# Patient Record
Sex: Female | Born: 1955 | Race: White | Hispanic: No | Marital: Single | Smoking: Never smoker
Health system: Southern US, Community
[De-identification: ages and names within clinical notes are randomized; demographics above are authoritative.]

## PROBLEM LIST (undated history)

## (undated) DIAGNOSIS — E785 Hyperlipidemia, unspecified: Secondary | ICD-10-CM

## (undated) DIAGNOSIS — L719 Rosacea, unspecified: Secondary | ICD-10-CM

## (undated) DIAGNOSIS — K219 Gastro-esophageal reflux disease without esophagitis: Secondary | ICD-10-CM

## (undated) DIAGNOSIS — E119 Type 2 diabetes mellitus without complications: Secondary | ICD-10-CM

## (undated) HISTORY — DX: Hyperlipidemia, unspecified: E78.5

## (undated) HISTORY — DX: Gastro-esophageal reflux disease without esophagitis: K21.9

## (undated) HISTORY — DX: Rosacea, unspecified: L71.9

## (undated) HISTORY — DX: Type 2 diabetes mellitus without complications: E11.9

---

## 2008-08-01 ENCOUNTER — Emergency Department (HOSPITAL_BASED_OUTPATIENT_CLINIC_OR_DEPARTMENT_OTHER): Admission: EM | Admit: 2008-08-01 | Discharge: 2008-08-01 | Payer: Self-pay | Admitting: Emergency Medicine

## 2008-08-02 ENCOUNTER — Emergency Department (HOSPITAL_BASED_OUTPATIENT_CLINIC_OR_DEPARTMENT_OTHER): Admission: EM | Admit: 2008-08-02 | Discharge: 2008-08-02 | Payer: Self-pay | Admitting: Emergency Medicine

## 2009-12-20 ENCOUNTER — Other Ambulatory Visit: Admission: RE | Admit: 2009-12-20 | Discharge: 2009-12-20 | Payer: Self-pay | Admitting: Obstetrics and Gynecology

## 2013-12-12 ENCOUNTER — Encounter: Payer: Self-pay | Admitting: *Deleted

## 2014-03-25 ENCOUNTER — Other Ambulatory Visit (HOSPITAL_COMMUNITY)
Admission: RE | Admit: 2014-03-25 | Discharge: 2014-03-25 | Disposition: A | Discharge status: Home / Self Care | Payer: 59 | Source: Ambulatory Visit | Attending: Obstetrics and Gynecology | Admitting: Obstetrics and Gynecology

## 2014-03-25 ENCOUNTER — Other Ambulatory Visit: Payer: Self-pay | Admitting: Obstetrics and Gynecology

## 2014-03-25 DIAGNOSIS — Z01419 Encounter for gynecological examination (general) (routine) without abnormal findings: Secondary | ICD-10-CM | POA: Diagnosis not present

## 2014-03-25 DIAGNOSIS — Z1151 Encounter for screening for human papillomavirus (HPV): Secondary | ICD-10-CM | POA: Diagnosis present

## 2014-04-03 LAB — CYTOLOGY - PAP

## 2014-10-02 ENCOUNTER — Ambulatory Visit (INDEPENDENT_AMBULATORY_CARE_PROVIDER_SITE_OTHER): Payer: 59 | Admitting: Podiatry

## 2014-10-02 ENCOUNTER — Ambulatory Visit: Payer: Self-pay

## 2014-10-02 ENCOUNTER — Encounter: Payer: Self-pay | Admitting: Podiatry

## 2014-10-02 VITALS — BP 127/78 | HR 82 | Resp 15 | Ht 61.5 in | Wt 159.0 lb

## 2014-10-02 DIAGNOSIS — E119 Type 2 diabetes mellitus without complications: Secondary | ICD-10-CM | POA: Diagnosis not present

## 2014-10-02 DIAGNOSIS — L84 Corns and callosities: Secondary | ICD-10-CM | POA: Diagnosis not present

## 2014-10-02 DIAGNOSIS — M79671 Pain in right foot: Secondary | ICD-10-CM | POA: Diagnosis not present

## 2014-10-02 NOTE — Progress Notes (Signed)
   Subjective:    Patient ID: Melanie Ross, female    DOB: 1955-08-25, 59 y.o.   MRN: [id number]  HPI Patient presents with right foot, great toe. Patient slammed toe 3 months ago. Does not feel any pain, but walking on to is uncomfortable. Does feel some discomfort. 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 [facility] age 89 [facility]. 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 Pt does not want anything to be done to this.  Pt also presents with fungus on right foot, great toe. Pt would like to have nails looked at to see if fungus is on other nails. Pt does have orthotics and is diabetic.   Review of Systems  Eyes: Positive for visual disturbance.  Neurological: Positive for weakness and numbness.  Psychiatric/Behavioral: The patient is nervous/anxious.   All other systems reviewed and are negative.      Objective:   Physical Exam        Assessment & Plan:

## 2014-10-12 ENCOUNTER — Ambulatory Visit: Payer: 59 | Admitting: Podiatry

## 2015-05-21 DIAGNOSIS — L84 Corns and callosities: Secondary | ICD-10-CM

## 2016-09-26 DIAGNOSIS — E782 Mixed hyperlipidemia: Secondary | ICD-10-CM | POA: Diagnosis not present

## 2016-09-26 DIAGNOSIS — K219 Gastro-esophageal reflux disease without esophagitis: Secondary | ICD-10-CM | POA: Diagnosis not present

## 2016-09-26 DIAGNOSIS — Z1159 Encounter for screening for other viral diseases: Secondary | ICD-10-CM | POA: Diagnosis not present

## 2016-09-26 DIAGNOSIS — Z7984 Long term (current) use of oral hypoglycemic drugs: Secondary | ICD-10-CM | POA: Diagnosis not present

## 2016-09-26 DIAGNOSIS — E1165 Type 2 diabetes mellitus with hyperglycemia: Secondary | ICD-10-CM | POA: Diagnosis not present

## 2016-12-29 DIAGNOSIS — H25013 Cortical age-related cataract, bilateral: Secondary | ICD-10-CM | POA: Diagnosis not present

## 2016-12-29 DIAGNOSIS — Z7984 Long term (current) use of oral hypoglycemic drugs: Secondary | ICD-10-CM | POA: Diagnosis not present

## 2016-12-29 DIAGNOSIS — E113313 Type 2 diabetes mellitus with moderate nonproliferative diabetic retinopathy with macular edema, bilateral: Secondary | ICD-10-CM | POA: Diagnosis not present

## 2016-12-29 DIAGNOSIS — H2513 Age-related nuclear cataract, bilateral: Secondary | ICD-10-CM | POA: Diagnosis not present

## 2017-04-02 ENCOUNTER — Other Ambulatory Visit (HOSPITAL_COMMUNITY)
Admission: RE | Admit: 2017-04-02 | Discharge: 2017-04-02 | Disposition: A | Discharge status: Home / Self Care | Payer: BLUE CROSS/BLUE SHIELD | Source: Ambulatory Visit | Attending: Obstetrics and Gynecology | Admitting: Obstetrics and Gynecology

## 2017-04-02 ENCOUNTER — Other Ambulatory Visit: Payer: Self-pay | Admitting: Obstetrics and Gynecology

## 2017-04-02 DIAGNOSIS — Z01419 Encounter for gynecological examination (general) (routine) without abnormal findings: Secondary | ICD-10-CM | POA: Insufficient documentation

## 2017-04-03 LAB — CYTOLOGY - PAP
DIAGNOSIS: NEGATIVE
HPV: NOT DETECTED

## 2017-04-16 DIAGNOSIS — E782 Mixed hyperlipidemia: Secondary | ICD-10-CM | POA: Diagnosis not present

## 2017-04-16 DIAGNOSIS — E119 Type 2 diabetes mellitus without complications: Secondary | ICD-10-CM | POA: Diagnosis not present

## 2017-04-16 DIAGNOSIS — K219 Gastro-esophageal reflux disease without esophagitis: Secondary | ICD-10-CM | POA: Diagnosis not present

## 2017-04-16 DIAGNOSIS — L719 Rosacea, unspecified: Secondary | ICD-10-CM | POA: Diagnosis not present

## 2017-05-01 DIAGNOSIS — E113392 Type 2 diabetes mellitus with moderate nonproliferative diabetic retinopathy without macular edema, left eye: Secondary | ICD-10-CM | POA: Diagnosis not present

## 2017-05-01 DIAGNOSIS — Z7984 Long term (current) use of oral hypoglycemic drugs: Secondary | ICD-10-CM | POA: Diagnosis not present

## 2017-05-01 DIAGNOSIS — E113311 Type 2 diabetes mellitus with moderate nonproliferative diabetic retinopathy with macular edema, right eye: Secondary | ICD-10-CM | POA: Diagnosis not present

## 2017-07-23 ENCOUNTER — Ambulatory Visit (INDEPENDENT_AMBULATORY_CARE_PROVIDER_SITE_OTHER): Payer: BLUE CROSS/BLUE SHIELD

## 2017-07-23 ENCOUNTER — Ambulatory Visit (INDEPENDENT_AMBULATORY_CARE_PROVIDER_SITE_OTHER): Payer: BLUE CROSS/BLUE SHIELD | Admitting: Orthopedic Surgery

## 2017-07-23 ENCOUNTER — Encounter (INDEPENDENT_AMBULATORY_CARE_PROVIDER_SITE_OTHER): Payer: Self-pay | Admitting: Orthopedic Surgery

## 2017-07-23 DIAGNOSIS — M25562 Pain in left knee: Secondary | ICD-10-CM

## 2017-07-23 MED ORDER — LIDOCAINE HCL 1 % IJ SOLN
5.0000 mL | INTRAMUSCULAR | Status: AC | PRN
  Administered 2017-07-23: 5 mL

## 2017-07-23 MED ORDER — METHYLPREDNISOLONE ACETATE 40 MG/ML IJ SUSP
40.0000 mg | INTRAMUSCULAR | Status: AC | PRN
  Administered 2017-07-23: 40 mg via INTRA_ARTICULAR

## 2017-07-23 NOTE — Progress Notes (Signed)
Office Visit Note   Patient: Melanie Ross           Date of Birth: 07-Dec-1955           MRN: [id number] Visit Date: 07/23/2017              Requested by: Clovis Riley, L.August Saucer, MD [address] [place], [place] [address] PCP: Clovis Riley, L.August Saucer, MD  Chief Complaint  Patient presents with  . Left Knee - Follow-up      HPI: Patient is a 62 year old woman who presents with left knee pain for 9 months.  She states she initially injured it about 9 months ago and felt a crunching sensation in her knee.  She states she has had pain burning and popping worse with walking.  She uses an over-the-counter neoprene sleeve.  She states she has an allergy to aspirin.  Assessment & Plan: Visit Diagnoses:  1. Left knee pain, unspecified chronicity     Plan: Left knee was injected follow-up in 3 weeks for reevaluation.  Discussed that if she is still symptomatic we would need to set her up for an MRI scan to evaluate for possible osteochondral defect or meniscal tear.  Follow-Up Instructions: Return in about 3 weeks (around 08/13/2017).   Ortho Exam  Patient is alert, oriented, no adenopathy, well-dressed, normal affect, normal respiratory effort. Examination patient has an antalgic gait there is no effusion no redness no cellulitis.  She is very tender to palpation over the medial joint line flexion and rotation is painful.  Collaterals and cruciates are stable.  Imaging: Xr Knee 1-2 Views Left  Result Date: 07/23/2017 2 view radiographs of the left knee shows even joint space of the medial joint line in both knees with no subcondylar cysts no periarticular bony spurs.  No images are attached to the encounter.  Labs: No results found for: HGBA1C, ESRSEDRATE, CRP, LABURIC, REPTSTATUS, GRAMSTAIN, CULT, LABORGA  No results found for: HGBA1C  There is no height or weight on file to calculate BMI.  Orders:  Orders Placed This Encounter  Procedures  . XR Knee 1-2 Views Left    No orders of the defined types were placed in this encounter.    Procedures: Large Joint Inj: L knee on 07/23/2017 9:17 AM Indications: pain and diagnostic evaluation Details: 22 G 1.5 in needle, anteromedial approach  Arthrogram: No  Medications: 5 mL lidocaine 1 %; 40 mg methylPREDNISolone acetate 40 MG/ML Outcome: tolerated well, no immediate complications Procedure, treatment alternatives, risks and benefits explained, specific risks discussed. Consent was given by the patient. Immediately prior to procedure a time out was called to verify the correct patient, procedure, equipment, support staff and site/side marked as required. Patient was prepped and draped in the usual sterile fashion.      Clinical Data: No additional findings.  ROS:  All other systems negative, except as noted in the HPI. Review of Systems  Objective: Vital Signs: There were no vitals taken for this visit.  Specialty Comments:  No specialty comments available.  PMFS History: There are no active problems to display for this patient.  Past Medical History:  Diagnosis Date  . Diabetes (HCC)   . GERD (gastroesophageal reflux disease)   . Hyperlipidemia   . Rosacea     Family History  Family history unknown: Yes    History reviewed. No pertinent surgical history. Social History   Occupational History  . Not on file  Tobacco Use  . Smoking status: Never  Smoker  . Smokeless tobacco: Never Used  Substance and Sexual Activity  . Alcohol use: Not on file  . Drug use: Not on file  . Sexual activity: Not on file

## 2017-08-06 ENCOUNTER — Other Ambulatory Visit (INDEPENDENT_AMBULATORY_CARE_PROVIDER_SITE_OTHER): Payer: Self-pay | Admitting: Orthopedic Surgery

## 2017-08-06 ENCOUNTER — Telehealth (INDEPENDENT_AMBULATORY_CARE_PROVIDER_SITE_OTHER): Payer: Self-pay | Admitting: Orthopedic Surgery

## 2017-08-06 DIAGNOSIS — M25562 Pain in left knee: Principal | ICD-10-CM

## 2017-08-06 DIAGNOSIS — G8929 Other chronic pain: Secondary | ICD-10-CM

## 2017-08-06 NOTE — Telephone Encounter (Signed)
Patient called this morning stating that the Cortisone injection did not really work for her and she would like to move forward with the MRI.  CB#[phone].  Thank you.

## 2017-08-06 NOTE — Telephone Encounter (Signed)
Pt had injection 07/23/17 left knee and states that it has not helped. She wants to proceed with MRI to r/o [facility] or meniscal tear as dictated in your last note. Ok to set this up now or see her first?

## 2017-08-06 NOTE — Telephone Encounter (Signed)
I placed order for mri left knee

## 2017-08-06 NOTE — Telephone Encounter (Signed)
Called pt to advise

## 2017-08-11 ENCOUNTER — Ambulatory Visit (HOSPITAL_BASED_OUTPATIENT_CLINIC_OR_DEPARTMENT_OTHER)
Admission: RE | Admit: 2017-08-11 | Discharge: 2017-08-11 | Disposition: A | Discharge status: Home / Self Care | Payer: BLUE CROSS/BLUE SHIELD | Source: Ambulatory Visit | Attending: Orthopedic Surgery | Admitting: Orthopedic Surgery

## 2017-08-11 DIAGNOSIS — M25462 Effusion, left knee: Secondary | ICD-10-CM | POA: Insufficient documentation

## 2017-08-11 DIAGNOSIS — S83242A Other tear of medial meniscus, current injury, left knee, initial encounter: Secondary | ICD-10-CM | POA: Diagnosis not present

## 2017-08-11 DIAGNOSIS — S83282A Other tear of lateral meniscus, current injury, left knee, initial encounter: Secondary | ICD-10-CM | POA: Diagnosis not present

## 2017-08-11 DIAGNOSIS — G8929 Other chronic pain: Secondary | ICD-10-CM

## 2017-08-11 DIAGNOSIS — M7122 Synovial cyst of popliteal space [Baker], left knee: Secondary | ICD-10-CM | POA: Diagnosis not present

## 2017-08-11 DIAGNOSIS — M25562 Pain in left knee: Secondary | ICD-10-CM | POA: Diagnosis not present

## 2017-08-11 DIAGNOSIS — S83412A Sprain of medial collateral ligament of left knee, initial encounter: Secondary | ICD-10-CM | POA: Insufficient documentation

## 2017-08-14 ENCOUNTER — Encounter (INDEPENDENT_AMBULATORY_CARE_PROVIDER_SITE_OTHER): Payer: Self-pay | Admitting: Orthopedic Surgery

## 2017-08-14 ENCOUNTER — Ambulatory Visit (INDEPENDENT_AMBULATORY_CARE_PROVIDER_SITE_OTHER): Payer: BLUE CROSS/BLUE SHIELD | Admitting: Orthopedic Surgery

## 2017-08-14 DIAGNOSIS — S83242D Other tear of medial meniscus, current injury, left knee, subsequent encounter: Secondary | ICD-10-CM | POA: Diagnosis not present

## 2017-08-14 DIAGNOSIS — G8929 Other chronic pain: Secondary | ICD-10-CM | POA: Diagnosis not present

## 2017-08-14 DIAGNOSIS — M25562 Pain in left knee: Secondary | ICD-10-CM

## 2017-08-14 NOTE — Progress Notes (Signed)
   Office Visit Note   Patient: Melanie Ross           Date of Birth: Aug 05, 1955           MRN: [id number] Visit Date: 08/14/2017              Requested by: Clovis Riley, L.August Saucer, MD [address] [place], [place] [address] PCP: Clovis Riley, L.August Saucer, MD  Chief Complaint  Patient presents with  . Left Knee - Follow-up      HPI: Patient is a 62 year old woman who presents in follow-up for left knee she complains of mechanical symptoms the medial joint line.  She states that the injection relieved most of her symptoms other than the medial joint line symptoms.  Assessment & Plan: Visit Diagnoses:  1. Chronic pain of left knee   2. Other tear of medial meniscus, current injury, left knee, subsequent encounter     Plan: Continue conservative treatment.  Discussed that if her symptoms worsen that we could proceed with arthroscopic intervention.  Discussed that this should relieve the symptoms from the meniscal tear but is about 50-50 of relieving the arthritic symptoms.  Discussed that she may require a total knee arthroplasty in the future.  She will call Elnita Maxwell if she wants to pursue outpatient arthroscopy for the left knee.  Follow-Up Instructions: Return if symptoms worsen or fail to improve.   Ortho Exam  Patient is alert, oriented, no adenopathy, well-dressed, normal affect, normal respiratory effort. Examination patient has an antalgic gait she is tender to palpation of the medial joint line collaterals and cruciates are stable.  Review of the MRI scan of the left knee shows a tear of the medial meniscus as well as a tear of the lateral meniscus a Baker's cyst and degenerative changes of the medial joint line.  Imaging: No results found. No images are attached to the encounter.  Labs: No results found for: HGBA1C, ESRSEDRATE, CRP, LABURIC, REPTSTATUS, GRAMSTAIN, CULT, LABORGA   No results found for: ALBUMIN, PREALBUMIN, LABURIC  There is no height or weight on  file to calculate BMI.  Orders:  No orders of the defined types were placed in this encounter.  No orders of the defined types were placed in this encounter.    Procedures: No procedures performed  Clinical Data: No additional findings.  ROS:  All other systems negative, except as noted in the HPI. Review of Systems  Objective: Vital Signs: There were no vitals taken for this visit.  Specialty Comments:  No specialty comments available.  PMFS History: There are no active problems to display for this patient.  Past Medical History:  Diagnosis Date  . Diabetes (HCC)   . GERD (gastroesophageal reflux disease)   . Hyperlipidemia   . Rosacea     Family History  Family history unknown: Yes    History reviewed. No pertinent surgical history. Social History   Occupational History  . Not on file  Tobacco Use  . Smoking status: Never Smoker  . Smokeless tobacco: Never Used  Substance and Sexual Activity  . Alcohol use: Not on file  . Drug use: Not on file  . Sexual activity: Not on file

## 2017-08-20 DIAGNOSIS — H25013 Cortical age-related cataract, bilateral: Secondary | ICD-10-CM | POA: Diagnosis not present

## 2017-08-20 DIAGNOSIS — E113311 Type 2 diabetes mellitus with moderate nonproliferative diabetic retinopathy with macular edema, right eye: Secondary | ICD-10-CM | POA: Diagnosis not present

## 2017-08-20 DIAGNOSIS — E113392 Type 2 diabetes mellitus with moderate nonproliferative diabetic retinopathy without macular edema, left eye: Secondary | ICD-10-CM | POA: Diagnosis not present

## 2017-08-20 DIAGNOSIS — H2513 Age-related nuclear cataract, bilateral: Secondary | ICD-10-CM | POA: Diagnosis not present

## 2017-08-21 ENCOUNTER — Telehealth (INDEPENDENT_AMBULATORY_CARE_PROVIDER_SITE_OTHER): Payer: Self-pay | Admitting: Orthopedic Surgery

## 2017-08-21 NOTE — Telephone Encounter (Signed)
You saw this pt on 08/14/17 and had discussed possible knee scope for meniscal tear but advised that this will not help her arthritic pain. Pt is wanting to know if she can have a gel injection if this would be helpful and question what the procedure would be if she had surgery

## 2017-08-21 NOTE — Telephone Encounter (Signed)
Yes we can request a hyaluronic acid injection from insurance and provide this when available.  This should help cushion her arthritis in her knee.  The arthroscopic intervention would be to debride the meniscal tear and debride any articular cartilage tears.

## 2017-08-21 NOTE — Telephone Encounter (Signed)
Patient called wanting to know more about her possible Arthroscopic surgery and also wanted to see if she could possibly get a gel injection instead of surgery. CB # [phone]

## 2017-08-22 ENCOUNTER — Telehealth (INDEPENDENT_AMBULATORY_CARE_PROVIDER_SITE_OTHER): Payer: Self-pay

## 2017-08-22 NOTE — Telephone Encounter (Signed)
Submitted application online for SynviscOne injection, left knee.  

## 2017-08-22 NOTE — Telephone Encounter (Signed)
I called pt to advise of the message below. She would like to have injection. Advised that she would like to have before 09/17/17 as it has something to do with her medical coverage or her deductible starting over?  I advised that I would advise to make this an urgent request.

## 2017-08-22 NOTE — Telephone Encounter (Signed)
Noted.  Will submit today.

## 2017-08-24 DIAGNOSIS — E113311 Type 2 diabetes mellitus with moderate nonproliferative diabetic retinopathy with macular edema, right eye: Secondary | ICD-10-CM | POA: Diagnosis not present

## 2017-08-24 DIAGNOSIS — H25013 Cortical age-related cataract, bilateral: Secondary | ICD-10-CM | POA: Diagnosis not present

## 2017-08-24 DIAGNOSIS — E113392 Type 2 diabetes mellitus with moderate nonproliferative diabetic retinopathy without macular edema, left eye: Secondary | ICD-10-CM | POA: Diagnosis not present

## 2017-08-24 DIAGNOSIS — H2513 Age-related nuclear cataract, bilateral: Secondary | ICD-10-CM | POA: Diagnosis not present

## 2017-08-30 ENCOUNTER — Encounter (INDEPENDENT_AMBULATORY_CARE_PROVIDER_SITE_OTHER): Payer: Self-pay | Admitting: Radiology

## 2017-08-30 NOTE — Progress Notes (Unsigned)
[facility] Synvisc One injection.  IC patient and advised.

## 2017-09-03 ENCOUNTER — Telehealth (INDEPENDENT_AMBULATORY_CARE_PROVIDER_SITE_OTHER): Payer: Self-pay | Admitting: Orthopedic Surgery

## 2017-09-03 ENCOUNTER — Telehealth (INDEPENDENT_AMBULATORY_CARE_PROVIDER_SITE_OTHER): Payer: Self-pay

## 2017-09-03 DIAGNOSIS — H2513 Age-related nuclear cataract, bilateral: Secondary | ICD-10-CM | POA: Diagnosis not present

## 2017-09-03 DIAGNOSIS — D3131 Benign neoplasm of right choroid: Secondary | ICD-10-CM | POA: Diagnosis not present

## 2017-09-03 DIAGNOSIS — E113311 Type 2 diabetes mellitus with moderate nonproliferative diabetic retinopathy with macular edema, right eye: Secondary | ICD-10-CM | POA: Diagnosis not present

## 2017-09-03 NOTE — Telephone Encounter (Signed)
Patient called stating that she has not heard anything about her Synvisc injection and wanted to know the status.  CB#[phone].  Thank you.

## 2017-09-03 NOTE — Telephone Encounter (Signed)
Talked with patient and advised her that PA was submitted on 08/30/17 to [facility] and we are currently waiting on a response from [facility].  Advised patient that we will call once PA has been approved.

## 2017-09-04 NOTE — Telephone Encounter (Signed)
April spoke to patient with update yesterday

## 2017-09-06 ENCOUNTER — Telehealth (INDEPENDENT_AMBULATORY_CARE_PROVIDER_SITE_OTHER): Payer: Self-pay

## 2017-09-06 NOTE — Telephone Encounter (Signed)
Talked with patient and advised her that PA was still pending and that I would call and talk with [employer].  Spoke with [employer] and was advised that they never received PA that was fax[place]/17 from [place]  [place], marked PA forms as Urgent.

## 2017-09-10 ENCOUNTER — Encounter (INDEPENDENT_AMBULATORY_CARE_PROVIDER_SITE_OTHER): Payer: Self-pay | Admitting: Radiology

## 2017-09-10 NOTE — Progress Notes (Unsigned)
IC [employer] s/w Dayquan H.  Synvisc One is approved 09/06/17 through 09/06/18.   Auth # [id number] IC patient and scheduled appt with Lajoyce CornersDuda 09/13/17 to get injection, buy and bill, left knee.

## 2017-09-13 ENCOUNTER — Ambulatory Visit (INDEPENDENT_AMBULATORY_CARE_PROVIDER_SITE_OTHER): Payer: BLUE CROSS/BLUE SHIELD | Admitting: Orthopedic Surgery

## 2017-09-13 ENCOUNTER — Encounter (INDEPENDENT_AMBULATORY_CARE_PROVIDER_SITE_OTHER): Payer: Self-pay | Admitting: Orthopedic Surgery

## 2017-09-13 VITALS — Ht 61.0 in | Wt 159.0 lb

## 2017-09-13 DIAGNOSIS — S83242D Other tear of medial meniscus, current injury, left knee, subsequent encounter: Secondary | ICD-10-CM

## 2017-09-13 DIAGNOSIS — M25562 Pain in left knee: Principal | ICD-10-CM

## 2017-09-13 DIAGNOSIS — M1712 Unilateral primary osteoarthritis, left knee: Secondary | ICD-10-CM | POA: Diagnosis not present

## 2017-09-13 DIAGNOSIS — G8929 Other chronic pain: Secondary | ICD-10-CM

## 2017-09-13 MED ORDER — METHYLPREDNISOLONE ACETATE 40 MG/ML IJ SUSP
40.0000 mg | INTRAMUSCULAR | Status: AC | PRN
  Administered 2017-09-13: 40 mg via INTRA_ARTICULAR

## 2017-09-13 MED ORDER — LIDOCAINE HCL 1 % IJ SOLN
1.0000 mL | INTRAMUSCULAR | Status: AC | PRN
  Administered 2017-09-13: 1 mL

## 2017-09-13 MED ORDER — HYLAN G-F 20 48 MG/6ML IX SOSY
48.0000 mg | PREFILLED_SYRINGE | INTRA_ARTICULAR | Status: AC | PRN
  Administered 2017-09-13: 48 mg via INTRA_ARTICULAR

## 2017-09-13 NOTE — Progress Notes (Signed)
Office Visit Note   Patient: Melanie Ross           Date of Birth: 26-Dec-1955           MRN: [id number] Visit Date: 09/13/2017              Requested by: Clovis RileyMitchell, L.August Saucerean, MD [address] [place], [place] [address] PCP: Clovis RileyMitchell, L.August Saucerean, MD  Chief Complaint  Patient presents with  . Left Knee - Follow-up    synvisc injection buy and bill left knee      HPI: Patient is a 62 year old woman who presents in follow-up for her left knee she wished to proceed with a hyaluronic acid injection for the arthritis and meniscal pathology.  Assessment & Plan: Visit Diagnoses:  1. Chronic pain of left knee   2. Other tear of medial meniscus, current injury, left knee, subsequent encounter     Plan: The knee was injected she did have pain with weightbearing after the injection.  Recommended ice discussed since she was driving for a prolonged period of time that she would have stiffness after start up.  Follow-up if she is still symptomatic for possible evaluation for arthroscopy.  Follow-Up Instructions: Return if symptoms worsen or fail to improve.   Ortho Exam  Patient is alert, oriented, no adenopathy, well-dressed, normal affect, normal respiratory effort. Examination patient has no redness no cellulitis around the left knee.  After informed consent and sterile prepping she was first prepped with Betadine this was cleansed with alcohol she underwent a wheal of anesthesia with 1 cc of 1% lidocaine plain and the Synvisc 1 was injected without complications.  Imaging: No results found. No images are attached to the encounter.  Labs: No results found for: HGBA1C, ESRSEDRATE, CRP, LABURIC, REPTSTATUS, GRAMSTAIN, CULT, LABORGA   No results found for: ALBUMIN, PREALBUMIN, LABURIC  Body mass index is 30.04 kg/m.  Orders:  No orders of the defined types were placed in this encounter.  No orders of the defined types were placed in this encounter.     Procedures: Large Joint Inj: L knee on 09/13/2017 2:46 PM Indications: pain and diagnostic evaluation Details: 22 G 1.5 in needle, anteromedial approach  Arthrogram: No  Medications: 40 mg methylPREDNISolone acetate 40 MG/ML; 1 mL lidocaine 1 %; 48 mg Hylan 48 MG/6ML Outcome: tolerated well, no immediate complications Procedure, treatment alternatives, risks and benefits explained, specific risks discussed. Consent was given by the patient. Immediately prior to procedure a time out was called to verify the correct patient, procedure, equipment, support staff and site/side marked as required. Patient was prepped and draped in the usual sterile fashion.      Clinical Data: No additional findings.  ROS:  All other systems negative, except as noted in the HPI. Review of Systems  Objective: Vital Signs: Ht 5\' 1"  (1.549 m)   Wt 159 lb (72.1 kg)   BMI 30.04 kg/m   Specialty Comments:  No specialty comments available.  PMFS History: There are no active problems to display for this patient.  Past Medical History:  Diagnosis Date  . Diabetes (HCC)   . GERD (gastroesophageal reflux disease)   . Hyperlipidemia   . Rosacea     Family History  Family history unknown: Yes    History reviewed. No pertinent surgical history. Social History   Occupational History  . Not on file  Tobacco Use  . Smoking status: Never Smoker  . Smokeless tobacco: Never Used  Substance and Sexual  Activity  . Alcohol use: Not on file  . Drug use: Not on file  . Sexual activity: Not on file

## 2017-09-14 DIAGNOSIS — H53413 Scotoma involving central area, bilateral: Secondary | ICD-10-CM | POA: Diagnosis not present

## 2017-09-14 DIAGNOSIS — E11319 Type 2 diabetes mellitus with unspecified diabetic retinopathy without macular edema: Secondary | ICD-10-CM | POA: Diagnosis not present

## 2017-10-01 DIAGNOSIS — H43811 Vitreous degeneration, right eye: Secondary | ICD-10-CM | POA: Diagnosis not present

## 2017-10-01 DIAGNOSIS — E113311 Type 2 diabetes mellitus with moderate nonproliferative diabetic retinopathy with macular edema, right eye: Secondary | ICD-10-CM | POA: Diagnosis not present

## 2017-10-01 DIAGNOSIS — H2513 Age-related nuclear cataract, bilateral: Secondary | ICD-10-CM | POA: Diagnosis not present

## 2017-10-16 DIAGNOSIS — L719 Rosacea, unspecified: Secondary | ICD-10-CM | POA: Diagnosis not present

## 2017-10-16 DIAGNOSIS — K219 Gastro-esophageal reflux disease without esophagitis: Secondary | ICD-10-CM | POA: Diagnosis not present

## 2017-10-16 DIAGNOSIS — E119 Type 2 diabetes mellitus without complications: Secondary | ICD-10-CM | POA: Diagnosis not present

## 2017-10-16 DIAGNOSIS — E782 Mixed hyperlipidemia: Secondary | ICD-10-CM | POA: Diagnosis not present

## 2017-10-19 DIAGNOSIS — Z1231 Encounter for screening mammogram for malignant neoplasm of breast: Secondary | ICD-10-CM | POA: Diagnosis not present

## 2017-11-12 DIAGNOSIS — E113392 Type 2 diabetes mellitus with moderate nonproliferative diabetic retinopathy without macular edema, left eye: Secondary | ICD-10-CM | POA: Diagnosis not present

## 2017-11-12 DIAGNOSIS — H43811 Vitreous degeneration, right eye: Secondary | ICD-10-CM | POA: Diagnosis not present

## 2017-11-12 DIAGNOSIS — E113311 Type 2 diabetes mellitus with moderate nonproliferative diabetic retinopathy with macular edema, right eye: Secondary | ICD-10-CM | POA: Diagnosis not present

## 2017-11-12 DIAGNOSIS — D3131 Benign neoplasm of right choroid: Secondary | ICD-10-CM | POA: Diagnosis not present

## 2018-02-11 DIAGNOSIS — R05 Cough: Secondary | ICD-10-CM | POA: Diagnosis not present

## 2018-02-11 DIAGNOSIS — R0981 Nasal congestion: Secondary | ICD-10-CM | POA: Diagnosis not present

## 2018-02-12 DIAGNOSIS — J029 Acute pharyngitis, unspecified: Secondary | ICD-10-CM | POA: Diagnosis not present

## 2018-02-12 DIAGNOSIS — J028 Acute pharyngitis due to other specified organisms: Secondary | ICD-10-CM | POA: Diagnosis not present

## 2018-02-12 DIAGNOSIS — R51 Headache: Secondary | ICD-10-CM | POA: Diagnosis not present

## 2018-03-28 DIAGNOSIS — Z7984 Long term (current) use of oral hypoglycemic drugs: Secondary | ICD-10-CM | POA: Diagnosis not present

## 2018-03-28 DIAGNOSIS — H3589 Other specified retinal disorders: Secondary | ICD-10-CM | POA: Diagnosis not present

## 2018-03-28 DIAGNOSIS — E113311 Type 2 diabetes mellitus with moderate nonproliferative diabetic retinopathy with macular edema, right eye: Secondary | ICD-10-CM | POA: Diagnosis not present

## 2018-03-28 DIAGNOSIS — E113392 Type 2 diabetes mellitus with moderate nonproliferative diabetic retinopathy without macular edema, left eye: Secondary | ICD-10-CM | POA: Diagnosis not present

## 2018-04-24 DIAGNOSIS — K219 Gastro-esophageal reflux disease without esophagitis: Secondary | ICD-10-CM | POA: Diagnosis not present

## 2018-04-24 DIAGNOSIS — E11319 Type 2 diabetes mellitus with unspecified diabetic retinopathy without macular edema: Secondary | ICD-10-CM | POA: Diagnosis not present

## 2018-04-24 DIAGNOSIS — E782 Mixed hyperlipidemia: Secondary | ICD-10-CM | POA: Diagnosis not present

## 2018-04-24 DIAGNOSIS — L719 Rosacea, unspecified: Secondary | ICD-10-CM | POA: Diagnosis not present

## 2018-04-28 DIAGNOSIS — Z1211 Encounter for screening for malignant neoplasm of colon: Secondary | ICD-10-CM | POA: Diagnosis not present

## 2018-10-30 DIAGNOSIS — L719 Rosacea, unspecified: Secondary | ICD-10-CM | POA: Diagnosis not present

## 2018-10-30 DIAGNOSIS — E782 Mixed hyperlipidemia: Secondary | ICD-10-CM | POA: Diagnosis not present

## 2018-10-30 DIAGNOSIS — K219 Gastro-esophageal reflux disease without esophagitis: Secondary | ICD-10-CM | POA: Diagnosis not present

## 2018-10-30 DIAGNOSIS — E11319 Type 2 diabetes mellitus with unspecified diabetic retinopathy without macular edema: Secondary | ICD-10-CM | POA: Diagnosis not present

## 2018-11-11 DIAGNOSIS — E11319 Type 2 diabetes mellitus with unspecified diabetic retinopathy without macular edema: Secondary | ICD-10-CM | POA: Diagnosis not present

## 2018-11-26 DIAGNOSIS — H35351 Cystoid macular degeneration, right eye: Secondary | ICD-10-CM | POA: Diagnosis not present

## 2018-11-26 DIAGNOSIS — H3563 Retinal hemorrhage, bilateral: Secondary | ICD-10-CM | POA: Diagnosis not present

## 2018-11-26 DIAGNOSIS — E113392 Type 2 diabetes mellitus with moderate nonproliferative diabetic retinopathy without macular edema, left eye: Secondary | ICD-10-CM | POA: Diagnosis not present

## 2018-11-26 DIAGNOSIS — E113311 Type 2 diabetes mellitus with moderate nonproliferative diabetic retinopathy with macular edema, right eye: Secondary | ICD-10-CM | POA: Diagnosis not present

## 2018-11-26 DIAGNOSIS — Z7984 Long term (current) use of oral hypoglycemic drugs: Secondary | ICD-10-CM | POA: Diagnosis not present

## 2018-11-26 DIAGNOSIS — E119 Type 2 diabetes mellitus without complications: Secondary | ICD-10-CM | POA: Diagnosis not present

## 2019-03-27 DIAGNOSIS — H3563 Retinal hemorrhage, bilateral: Secondary | ICD-10-CM | POA: Diagnosis not present

## 2019-03-27 DIAGNOSIS — E113392 Type 2 diabetes mellitus with moderate nonproliferative diabetic retinopathy without macular edema, left eye: Secondary | ICD-10-CM | POA: Diagnosis not present

## 2019-03-27 DIAGNOSIS — H2513 Age-related nuclear cataract, bilateral: Secondary | ICD-10-CM | POA: Diagnosis not present

## 2019-03-27 DIAGNOSIS — Z7984 Long term (current) use of oral hypoglycemic drugs: Secondary | ICD-10-CM | POA: Diagnosis not present

## 2019-03-27 DIAGNOSIS — H35351 Cystoid macular degeneration, right eye: Secondary | ICD-10-CM | POA: Diagnosis not present

## 2019-03-27 DIAGNOSIS — E113311 Type 2 diabetes mellitus with moderate nonproliferative diabetic retinopathy with macular edema, right eye: Secondary | ICD-10-CM | POA: Diagnosis not present

## 2019-03-31 DIAGNOSIS — E113311 Type 2 diabetes mellitus with moderate nonproliferative diabetic retinopathy with macular edema, right eye: Secondary | ICD-10-CM | POA: Diagnosis not present

## 2019-05-14 DIAGNOSIS — Z1231 Encounter for screening mammogram for malignant neoplasm of breast: Secondary | ICD-10-CM | POA: Diagnosis not present

## 2019-05-14 DIAGNOSIS — E782 Mixed hyperlipidemia: Secondary | ICD-10-CM | POA: Diagnosis not present

## 2019-05-14 DIAGNOSIS — L719 Rosacea, unspecified: Secondary | ICD-10-CM | POA: Diagnosis not present

## 2019-05-14 DIAGNOSIS — Z0000 Encounter for general adult medical examination without abnormal findings: Secondary | ICD-10-CM | POA: Diagnosis not present

## 2019-05-14 DIAGNOSIS — E11319 Type 2 diabetes mellitus with unspecified diabetic retinopathy without macular edema: Secondary | ICD-10-CM | POA: Diagnosis not present

## 2019-05-14 DIAGNOSIS — K219 Gastro-esophageal reflux disease without esophagitis: Secondary | ICD-10-CM | POA: Diagnosis not present

## 2019-06-24 DIAGNOSIS — H35351 Cystoid macular degeneration, right eye: Secondary | ICD-10-CM | POA: Diagnosis not present

## 2019-06-24 DIAGNOSIS — E113311 Type 2 diabetes mellitus with moderate nonproliferative diabetic retinopathy with macular edema, right eye: Secondary | ICD-10-CM | POA: Diagnosis not present

## 2019-06-24 DIAGNOSIS — H3563 Retinal hemorrhage, bilateral: Secondary | ICD-10-CM | POA: Diagnosis not present

## 2019-06-24 DIAGNOSIS — E113392 Type 2 diabetes mellitus with moderate nonproliferative diabetic retinopathy without macular edema, left eye: Secondary | ICD-10-CM | POA: Diagnosis not present

## 2019-08-07 DIAGNOSIS — M65321 Trigger finger, right index finger: Secondary | ICD-10-CM | POA: Diagnosis not present

## 2019-08-07 DIAGNOSIS — M65341 Trigger finger, right ring finger: Secondary | ICD-10-CM | POA: Diagnosis not present

## 2019-08-07 DIAGNOSIS — M1811 Unilateral primary osteoarthritis of first carpometacarpal joint, right hand: Secondary | ICD-10-CM | POA: Diagnosis not present

## 2019-08-07 DIAGNOSIS — M7989 Other specified soft tissue disorders: Secondary | ICD-10-CM | POA: Diagnosis not present

## 2019-08-07 DIAGNOSIS — R2231 Localized swelling, mass and lump, right upper limb: Secondary | ICD-10-CM | POA: Diagnosis not present

## 2019-08-20 ENCOUNTER — Other Ambulatory Visit: Payer: Self-pay

## 2019-08-20 DIAGNOSIS — M67441 Ganglion, right hand: Secondary | ICD-10-CM | POA: Diagnosis not present

## 2019-08-20 DIAGNOSIS — M65341 Trigger finger, right ring finger: Secondary | ICD-10-CM | POA: Diagnosis not present

## 2019-10-27 DIAGNOSIS — E113392 Type 2 diabetes mellitus with moderate nonproliferative diabetic retinopathy without macular edema, left eye: Secondary | ICD-10-CM | POA: Diagnosis not present

## 2019-10-27 DIAGNOSIS — E119 Type 2 diabetes mellitus without complications: Secondary | ICD-10-CM | POA: Diagnosis not present

## 2019-10-27 DIAGNOSIS — E113311 Type 2 diabetes mellitus with moderate nonproliferative diabetic retinopathy with macular edema, right eye: Secondary | ICD-10-CM | POA: Diagnosis not present

## 2019-10-27 DIAGNOSIS — Z7984 Long term (current) use of oral hypoglycemic drugs: Secondary | ICD-10-CM | POA: Diagnosis not present

## 2019-11-05 DIAGNOSIS — R29898 Other symptoms and signs involving the musculoskeletal system: Secondary | ICD-10-CM | POA: Diagnosis not present

## 2019-11-05 DIAGNOSIS — M25641 Stiffness of right hand, not elsewhere classified: Secondary | ICD-10-CM | POA: Diagnosis not present

## 2019-11-05 DIAGNOSIS — M65341 Trigger finger, right ring finger: Secondary | ICD-10-CM | POA: Diagnosis not present

## 2019-11-05 DIAGNOSIS — M7989 Other specified soft tissue disorders: Secondary | ICD-10-CM | POA: Diagnosis not present

## 2019-11-06 IMAGING — MR MR KNEE*L* W/O CM
7 series · 40 of 40 positions shown · non-contrast
Comparison: None.

CLINICAL DATA: Left knee pain for 3-4 months. Reduced range of
motion with popping sensation.

EXAM:
MRI OF THE LEFT KNEE WITHOUT CONTRAST
TECHNIQUE: Multiplanar, multisequence MR imaging of the knee was performed. No
intravenous contrast was administered.

[Series 3: PD fat-sat · axial · 4.0mm · 0.62mm/px · z∈[-65,+54]mm · 7 of 25 slices shown (1 of 3)]
[im 1/25]
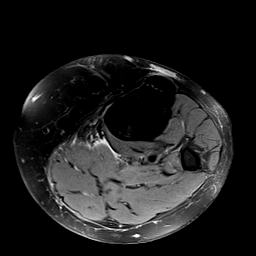
[im 5/25]
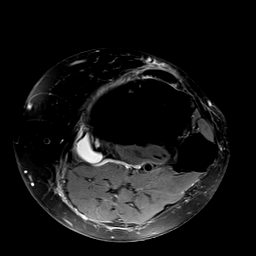
[im 9/25]
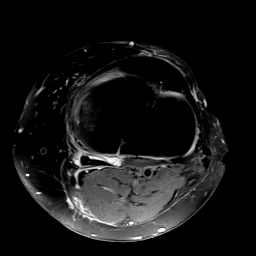
[im 13/25]
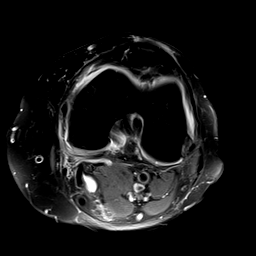
[im 17/25]
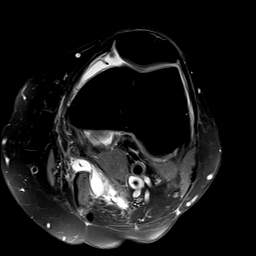
[im 21/25]
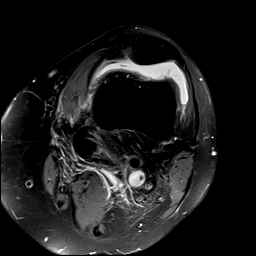
[im 25/25]
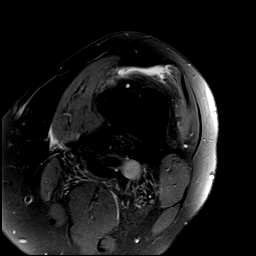

[Series 4: PD fat-sat · sagittal · 4.0mm · 0.62mm/px · 7 of 24 slices shown (2 of 3)]
[im 1/24]
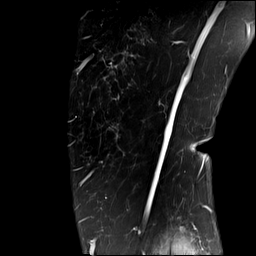
[im 4/24]
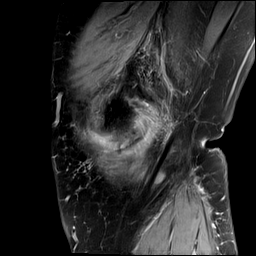
[im 8/24]
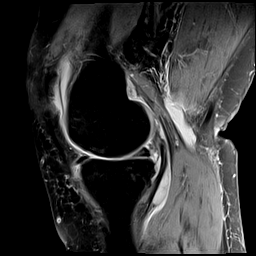
[im 12/24]
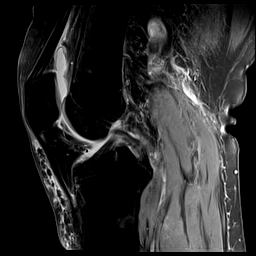
[im 16/24]
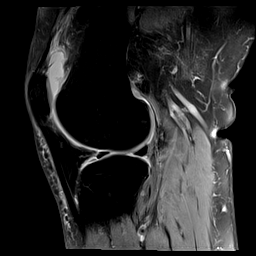
[im 20/24]
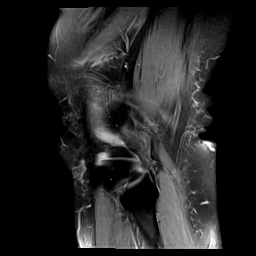
[im 24/24]
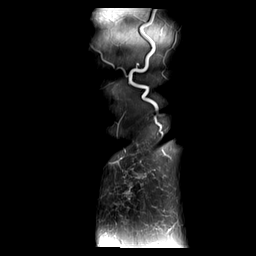

[Series 5: PD fat-sat · coronal · 4.0mm · 0.62mm/px · 7 of 24 slices shown (3 of 3)]
[im 1/24]
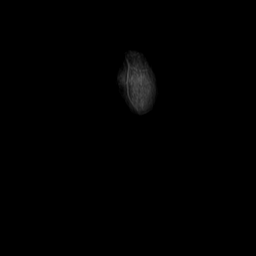
[im 4/24]
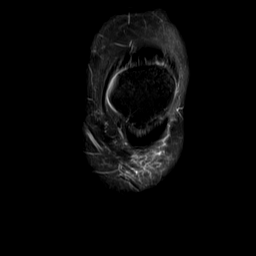
[im 8/24]
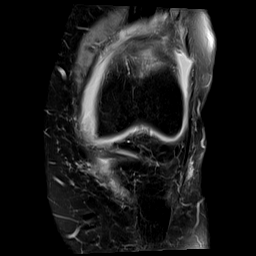
[im 12/24]
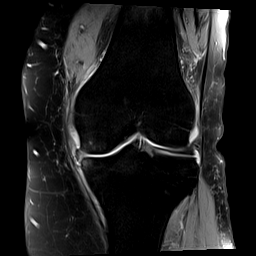
[im 16/24]
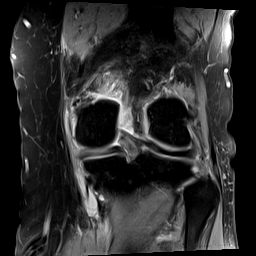
[im 20/24]
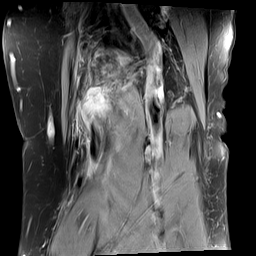
[im 24/24]
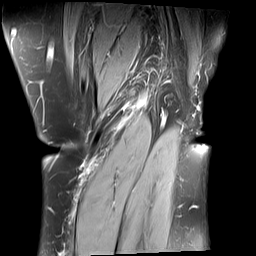

[Series 6: T2 fat-sat · coronal · 4.0mm · 0.62mm/px · 7 of 24 slices shown]
[im 1/24]
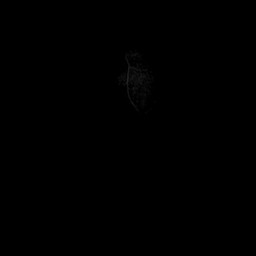
[im 4/24]
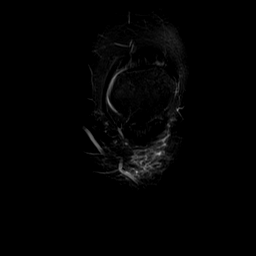
[im 8/24]
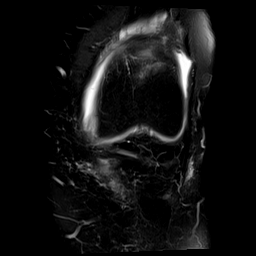
[im 12/24]
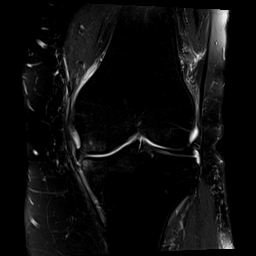
[im 16/24]
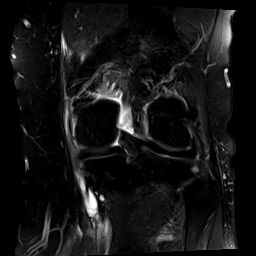
[im 20/24]
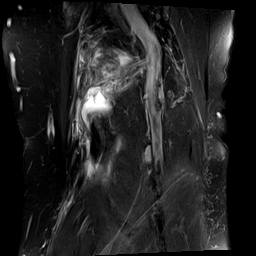
[im 24/24]
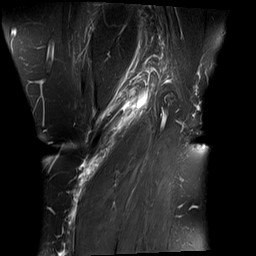

[Series 7: T1 · coronal · 4.0mm · 0.62mm/px · 7 of 24 slices shown]
[im 1/24]
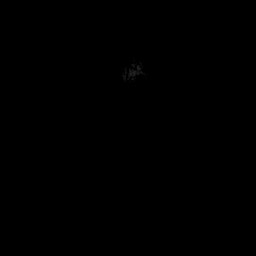
[im 4/24]
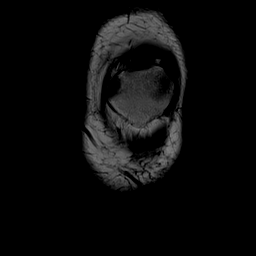
[im 8/24]
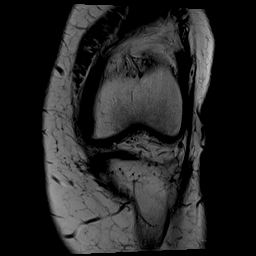
[im 12/24]
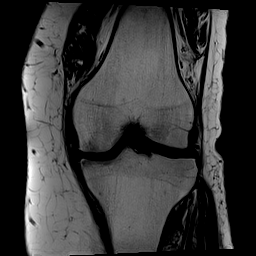
[im 16/24]
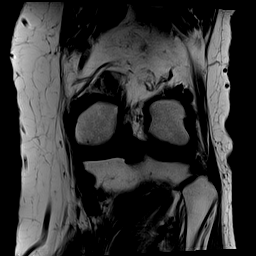
[im 20/24]
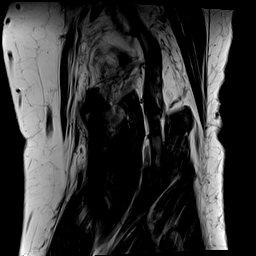
[im 24/24]
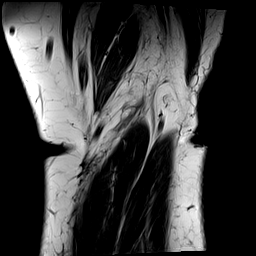

[Series 8: PD · coronal · 2.0mm · 0.50mm/px · 4 of 15 slices shown]
[im 1/15]
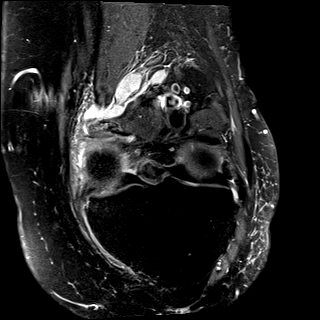
[im 5/15]
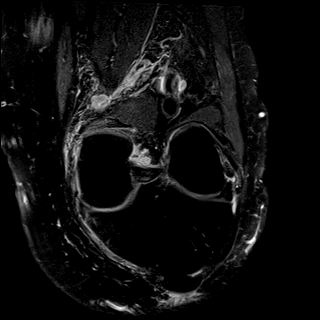
[im 10/15]
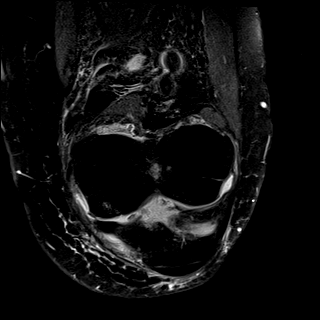
[im 15/15]
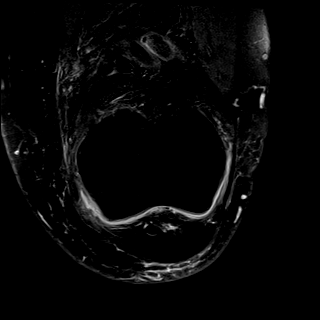

[Series 100: hx · axial · 8.0mm · 0.68mm/px · 1 of 3 slices shown]
[im 1/3]
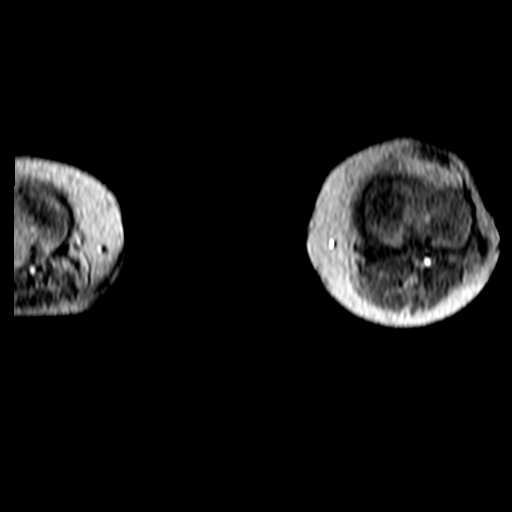

[40 of 40 positions shown; findings below may reference images not displayed]

FINDINGS: MENISCI

Medial meniscus: Radial tear of the midbody with ill definition of
this segment of the meniscus suggesting adjacent degenerative
tearing.

Lateral meniscus: Grade 3 signal in the midbody extends obliquely to
the inferior surface on image [DATE].

LIGAMENTS

Cruciates:  Unremarkable

Collaterals:  Mildly thickened MCL with surrounding edema.

CARTILAGE

Patellofemoral:  Unremarkable

Medial: Moderate degenerative chondral thinning with marginal
spurring and small foci of subcortical marrow edema along the medial
femoral condyle anteriorly, and along the medial rim of the medial
tibial plateau.

Lateral:  Mild degenerative chondral thinning.

Joint:  Moderate knee effusion with a thin medial plica.

Popliteal Fossa: Moderate size Baker's cyst likely partially
ruptured. Semimembranosus-tibial collateral ligament bursitis is
noted along with mild pes anserine bursitis.

Extensor Mechanism:  Unremarkable

Bones: No significant extra-articular osseous abnormalities
identified.

Other: No supplemental non-categorized findings.
IMPRESSION: 1. Radial tear of the midbody medial meniscus with adjacent
degenerative tearing.
2. Grade 3 oblique tear of the midbody lateral meniscus involving
the inferior surface.
3. Grade 2 sprain of the MCL.
4. Moderate knee effusion with thin medial plica.
5. Moderate size Baker's cyst with adjacent semimembranosus-tibial
collateral ligament bursitis and pes anserine bursitis.
6. Moderate degenerative chondral thinning in the medial compartment
with mild chondral thinning in the lateral compartment.

## 2019-11-11 ENCOUNTER — Other Ambulatory Visit: Payer: Self-pay | Admitting: Family Medicine

## 2019-11-11 ENCOUNTER — Ambulatory Visit
Admission: RE | Admit: 2019-11-11 | Discharge: 2019-11-11 | Disposition: A | Discharge status: Home / Self Care | Payer: BLUE CROSS/BLUE SHIELD | Source: Ambulatory Visit | Attending: Family Medicine | Admitting: Family Medicine

## 2019-11-11 DIAGNOSIS — E782 Mixed hyperlipidemia: Secondary | ICD-10-CM | POA: Diagnosis not present

## 2019-11-11 DIAGNOSIS — M79672 Pain in left foot: Secondary | ICD-10-CM

## 2019-11-11 DIAGNOSIS — K219 Gastro-esophageal reflux disease without esophagitis: Secondary | ICD-10-CM | POA: Diagnosis not present

## 2019-11-11 DIAGNOSIS — L719 Rosacea, unspecified: Secondary | ICD-10-CM | POA: Diagnosis not present

## 2019-11-11 DIAGNOSIS — M2012 Hallux valgus (acquired), left foot: Secondary | ICD-10-CM | POA: Diagnosis not present

## 2019-11-11 DIAGNOSIS — M7732 Calcaneal spur, left foot: Secondary | ICD-10-CM | POA: Diagnosis not present

## 2019-11-11 DIAGNOSIS — M19072 Primary osteoarthritis, left ankle and foot: Secondary | ICD-10-CM | POA: Diagnosis not present

## 2019-11-11 DIAGNOSIS — E11319 Type 2 diabetes mellitus with unspecified diabetic retinopathy without macular edema: Secondary | ICD-10-CM | POA: Diagnosis not present

## 2020-01-24 DIAGNOSIS — Z23 Encounter for immunization: Secondary | ICD-10-CM | POA: Diagnosis not present

## 2020-02-16 DIAGNOSIS — L814 Other melanin hyperpigmentation: Secondary | ICD-10-CM | POA: Diagnosis not present

## 2020-02-16 DIAGNOSIS — L719 Rosacea, unspecified: Secondary | ICD-10-CM | POA: Diagnosis not present

## 2020-02-16 DIAGNOSIS — T364X5A Adverse effect of tetracyclines, initial encounter: Secondary | ICD-10-CM | POA: Diagnosis not present

## 2020-02-16 DIAGNOSIS — L819 Disorder of pigmentation, unspecified: Secondary | ICD-10-CM | POA: Diagnosis not present

## 2020-03-01 DIAGNOSIS — Z7984 Long term (current) use of oral hypoglycemic drugs: Secondary | ICD-10-CM | POA: Diagnosis not present

## 2020-03-01 DIAGNOSIS — E119 Type 2 diabetes mellitus without complications: Secondary | ICD-10-CM | POA: Diagnosis not present

## 2020-03-01 DIAGNOSIS — E113392 Type 2 diabetes mellitus with moderate nonproliferative diabetic retinopathy without macular edema, left eye: Secondary | ICD-10-CM | POA: Diagnosis not present

## 2020-03-01 DIAGNOSIS — E113311 Type 2 diabetes mellitus with moderate nonproliferative diabetic retinopathy with macular edema, right eye: Secondary | ICD-10-CM | POA: Diagnosis not present

## 2020-03-01 DIAGNOSIS — H3563 Retinal hemorrhage, bilateral: Secondary | ICD-10-CM | POA: Diagnosis not present

## 2020-05-10 DIAGNOSIS — K219 Gastro-esophageal reflux disease without esophagitis: Secondary | ICD-10-CM | POA: Diagnosis not present

## 2020-05-10 DIAGNOSIS — L719 Rosacea, unspecified: Secondary | ICD-10-CM | POA: Diagnosis not present

## 2020-05-10 DIAGNOSIS — E782 Mixed hyperlipidemia: Secondary | ICD-10-CM | POA: Diagnosis not present

## 2020-05-10 DIAGNOSIS — E11319 Type 2 diabetes mellitus with unspecified diabetic retinopathy without macular edema: Secondary | ICD-10-CM | POA: Diagnosis not present

## 2020-08-03 DIAGNOSIS — E113313 Type 2 diabetes mellitus with moderate nonproliferative diabetic retinopathy with macular edema, bilateral: Secondary | ICD-10-CM | POA: Diagnosis not present

## 2020-08-03 DIAGNOSIS — H3589 Other specified retinal disorders: Secondary | ICD-10-CM | POA: Diagnosis not present

## 2020-08-03 DIAGNOSIS — Z7984 Long term (current) use of oral hypoglycemic drugs: Secondary | ICD-10-CM | POA: Diagnosis not present

## 2020-08-03 DIAGNOSIS — H2513 Age-related nuclear cataract, bilateral: Secondary | ICD-10-CM | POA: Diagnosis not present

## 2020-08-03 DIAGNOSIS — E119 Type 2 diabetes mellitus without complications: Secondary | ICD-10-CM | POA: Diagnosis not present

## 2020-09-10 DIAGNOSIS — Z1231 Encounter for screening mammogram for malignant neoplasm of breast: Secondary | ICD-10-CM | POA: Diagnosis not present

## 2020-10-05 DIAGNOSIS — R928 Other abnormal and inconclusive findings on diagnostic imaging of breast: Secondary | ICD-10-CM | POA: Diagnosis not present

## 2020-10-05 DIAGNOSIS — R922 Inconclusive mammogram: Secondary | ICD-10-CM | POA: Diagnosis not present

## 2020-11-09 DIAGNOSIS — K219 Gastro-esophageal reflux disease without esophagitis: Secondary | ICD-10-CM | POA: Diagnosis not present

## 2020-11-09 DIAGNOSIS — L719 Rosacea, unspecified: Secondary | ICD-10-CM | POA: Diagnosis not present

## 2020-11-09 DIAGNOSIS — E11319 Type 2 diabetes mellitus with unspecified diabetic retinopathy without macular edema: Secondary | ICD-10-CM | POA: Diagnosis not present

## 2020-11-09 DIAGNOSIS — E782 Mixed hyperlipidemia: Secondary | ICD-10-CM | POA: Diagnosis not present

## 2021-01-03 DIAGNOSIS — E113313 Type 2 diabetes mellitus with moderate nonproliferative diabetic retinopathy with macular edema, bilateral: Secondary | ICD-10-CM | POA: Diagnosis not present

## 2021-01-03 DIAGNOSIS — Z7984 Long term (current) use of oral hypoglycemic drugs: Secondary | ICD-10-CM | POA: Diagnosis not present

## 2021-01-03 DIAGNOSIS — E119 Type 2 diabetes mellitus without complications: Secondary | ICD-10-CM | POA: Diagnosis not present

## 2021-01-03 DIAGNOSIS — H2513 Age-related nuclear cataract, bilateral: Secondary | ICD-10-CM | POA: Diagnosis not present

## 2021-04-07 DIAGNOSIS — M25552 Pain in left hip: Secondary | ICD-10-CM | POA: Diagnosis not present

## 2021-04-11 DIAGNOSIS — Z7409 Other reduced mobility: Secondary | ICD-10-CM | POA: Diagnosis not present

## 2021-04-11 DIAGNOSIS — M25552 Pain in left hip: Secondary | ICD-10-CM | POA: Diagnosis not present

## 2021-04-19 DIAGNOSIS — Z7409 Other reduced mobility: Secondary | ICD-10-CM | POA: Diagnosis not present

## 2021-04-19 DIAGNOSIS — M25552 Pain in left hip: Secondary | ICD-10-CM | POA: Diagnosis not present

## 2021-04-22 DIAGNOSIS — M25552 Pain in left hip: Secondary | ICD-10-CM | POA: Diagnosis not present

## 2021-04-22 DIAGNOSIS — Z7409 Other reduced mobility: Secondary | ICD-10-CM | POA: Diagnosis not present

## 2021-04-25 DIAGNOSIS — M25552 Pain in left hip: Secondary | ICD-10-CM | POA: Diagnosis not present

## 2021-04-25 DIAGNOSIS — Z7409 Other reduced mobility: Secondary | ICD-10-CM | POA: Diagnosis not present

## 2021-04-28 DIAGNOSIS — M25552 Pain in left hip: Secondary | ICD-10-CM | POA: Diagnosis not present

## 2021-04-28 DIAGNOSIS — Z7409 Other reduced mobility: Secondary | ICD-10-CM | POA: Diagnosis not present

## 2021-05-02 DIAGNOSIS — Z7409 Other reduced mobility: Secondary | ICD-10-CM | POA: Diagnosis not present

## 2021-05-02 DIAGNOSIS — M25552 Pain in left hip: Secondary | ICD-10-CM | POA: Diagnosis not present

## 2021-05-04 DIAGNOSIS — Z7409 Other reduced mobility: Secondary | ICD-10-CM | POA: Diagnosis not present

## 2021-05-04 DIAGNOSIS — M25552 Pain in left hip: Secondary | ICD-10-CM | POA: Diagnosis not present

## 2021-05-09 DIAGNOSIS — M25552 Pain in left hip: Secondary | ICD-10-CM | POA: Diagnosis not present

## 2021-05-09 DIAGNOSIS — Z7409 Other reduced mobility: Secondary | ICD-10-CM | POA: Diagnosis not present

## 2021-05-10 DIAGNOSIS — E119 Type 2 diabetes mellitus without complications: Secondary | ICD-10-CM | POA: Diagnosis not present

## 2021-05-10 DIAGNOSIS — E113313 Type 2 diabetes mellitus with moderate nonproliferative diabetic retinopathy with macular edema, bilateral: Secondary | ICD-10-CM | POA: Diagnosis not present

## 2021-05-10 DIAGNOSIS — H35353 Cystoid macular degeneration, bilateral: Secondary | ICD-10-CM | POA: Diagnosis not present

## 2021-05-10 DIAGNOSIS — Z7984 Long term (current) use of oral hypoglycemic drugs: Secondary | ICD-10-CM | POA: Diagnosis not present

## 2021-05-11 DIAGNOSIS — Z7409 Other reduced mobility: Secondary | ICD-10-CM | POA: Diagnosis not present

## 2021-05-11 DIAGNOSIS — M25552 Pain in left hip: Secondary | ICD-10-CM | POA: Diagnosis not present

## 2021-05-16 DIAGNOSIS — Z7409 Other reduced mobility: Secondary | ICD-10-CM | POA: Diagnosis not present

## 2021-05-16 DIAGNOSIS — M25552 Pain in left hip: Secondary | ICD-10-CM | POA: Diagnosis not present

## 2021-05-19 DIAGNOSIS — Z7409 Other reduced mobility: Secondary | ICD-10-CM | POA: Diagnosis not present

## 2021-05-19 DIAGNOSIS — L719 Rosacea, unspecified: Secondary | ICD-10-CM | POA: Diagnosis not present

## 2021-05-19 DIAGNOSIS — M25552 Pain in left hip: Secondary | ICD-10-CM | POA: Diagnosis not present

## 2021-05-19 DIAGNOSIS — E782 Mixed hyperlipidemia: Secondary | ICD-10-CM | POA: Diagnosis not present

## 2021-05-19 DIAGNOSIS — E11319 Type 2 diabetes mellitus with unspecified diabetic retinopathy without macular edema: Secondary | ICD-10-CM | POA: Diagnosis not present

## 2021-05-19 DIAGNOSIS — K219 Gastro-esophageal reflux disease without esophagitis: Secondary | ICD-10-CM | POA: Diagnosis not present

## 2021-05-23 DIAGNOSIS — Z7409 Other reduced mobility: Secondary | ICD-10-CM | POA: Diagnosis not present

## 2021-05-23 DIAGNOSIS — M25552 Pain in left hip: Secondary | ICD-10-CM | POA: Diagnosis not present

## 2021-05-25 DIAGNOSIS — M25552 Pain in left hip: Secondary | ICD-10-CM | POA: Diagnosis not present

## 2021-05-25 DIAGNOSIS — Z7409 Other reduced mobility: Secondary | ICD-10-CM | POA: Diagnosis not present

## 2021-05-30 DIAGNOSIS — M25552 Pain in left hip: Secondary | ICD-10-CM | POA: Diagnosis not present

## 2021-05-30 DIAGNOSIS — Z7409 Other reduced mobility: Secondary | ICD-10-CM | POA: Diagnosis not present

## 2021-06-03 DIAGNOSIS — M1612 Unilateral primary osteoarthritis, left hip: Secondary | ICD-10-CM | POA: Diagnosis not present

## 2021-06-03 DIAGNOSIS — N852 Hypertrophy of uterus: Secondary | ICD-10-CM | POA: Diagnosis not present

## 2021-06-03 DIAGNOSIS — S76012A Strain of muscle, fascia and tendon of left hip, initial encounter: Secondary | ICD-10-CM | POA: Diagnosis not present

## 2021-06-03 DIAGNOSIS — M67854 Other specified disorders of tendon, left hip: Secondary | ICD-10-CM | POA: Diagnosis not present

## 2021-06-03 DIAGNOSIS — S76312A Strain of muscle, fascia and tendon of the posterior muscle group at thigh level, left thigh, initial encounter: Secondary | ICD-10-CM | POA: Diagnosis not present

## 2021-06-06 DIAGNOSIS — M25552 Pain in left hip: Secondary | ICD-10-CM | POA: Diagnosis not present

## 2021-06-06 DIAGNOSIS — Z7409 Other reduced mobility: Secondary | ICD-10-CM | POA: Diagnosis not present

## 2021-06-13 DIAGNOSIS — M25552 Pain in left hip: Secondary | ICD-10-CM | POA: Diagnosis not present

## 2021-06-21 DIAGNOSIS — Z7409 Other reduced mobility: Secondary | ICD-10-CM | POA: Diagnosis not present

## 2021-06-21 DIAGNOSIS — M25552 Pain in left hip: Secondary | ICD-10-CM | POA: Diagnosis not present

## 2021-06-30 DIAGNOSIS — S76012A Strain of muscle, fascia and tendon of left hip, initial encounter: Secondary | ICD-10-CM | POA: Diagnosis not present

## 2021-08-08 DIAGNOSIS — G8929 Other chronic pain: Secondary | ICD-10-CM | POA: Diagnosis not present

## 2021-08-08 DIAGNOSIS — M7632 Iliotibial band syndrome, left leg: Secondary | ICD-10-CM | POA: Diagnosis not present

## 2021-08-08 DIAGNOSIS — M7062 Trochanteric bursitis, left hip: Secondary | ICD-10-CM | POA: Diagnosis not present

## 2021-08-08 DIAGNOSIS — M71552 Other bursitis, not elsewhere classified, left hip: Secondary | ICD-10-CM | POA: Diagnosis not present

## 2021-08-08 DIAGNOSIS — S76012A Strain of muscle, fascia and tendon of left hip, initial encounter: Secondary | ICD-10-CM | POA: Diagnosis not present

## 2021-08-08 DIAGNOSIS — X58XXXA Exposure to other specified factors, initial encounter: Secondary | ICD-10-CM | POA: Diagnosis not present

## 2021-08-08 DIAGNOSIS — M67854 Other specified disorders of tendon, left hip: Secondary | ICD-10-CM | POA: Diagnosis not present

## 2021-08-09 DIAGNOSIS — S76012A Strain of muscle, fascia and tendon of left hip, initial encounter: Secondary | ICD-10-CM | POA: Diagnosis not present

## 2021-09-13 DIAGNOSIS — E113313 Type 2 diabetes mellitus with moderate nonproliferative diabetic retinopathy with macular edema, bilateral: Secondary | ICD-10-CM | POA: Diagnosis not present

## 2021-09-13 DIAGNOSIS — E119 Type 2 diabetes mellitus without complications: Secondary | ICD-10-CM | POA: Diagnosis not present

## 2021-09-13 DIAGNOSIS — Z7984 Long term (current) use of oral hypoglycemic drugs: Secondary | ICD-10-CM | POA: Diagnosis not present

## 2021-09-13 DIAGNOSIS — H3589 Other specified retinal disorders: Secondary | ICD-10-CM | POA: Diagnosis not present

## 2021-09-22 DIAGNOSIS — Z7409 Other reduced mobility: Secondary | ICD-10-CM | POA: Diagnosis not present

## 2021-09-22 DIAGNOSIS — Z9889 Other specified postprocedural states: Secondary | ICD-10-CM | POA: Diagnosis not present

## 2021-09-22 DIAGNOSIS — M25552 Pain in left hip: Secondary | ICD-10-CM | POA: Diagnosis not present

## 2021-09-23 DIAGNOSIS — Z1231 Encounter for screening mammogram for malignant neoplasm of breast: Secondary | ICD-10-CM | POA: Diagnosis not present

## 2021-09-23 DIAGNOSIS — Z808 Family history of malignant neoplasm of other organs or systems: Secondary | ICD-10-CM | POA: Diagnosis not present

## 2021-09-27 DIAGNOSIS — Z9889 Other specified postprocedural states: Secondary | ICD-10-CM | POA: Diagnosis not present

## 2021-09-27 DIAGNOSIS — M25552 Pain in left hip: Secondary | ICD-10-CM | POA: Diagnosis not present

## 2021-09-27 DIAGNOSIS — Z7409 Other reduced mobility: Secondary | ICD-10-CM | POA: Diagnosis not present

## 2021-09-30 DIAGNOSIS — M25552 Pain in left hip: Secondary | ICD-10-CM | POA: Diagnosis not present

## 2021-09-30 DIAGNOSIS — Z7409 Other reduced mobility: Secondary | ICD-10-CM | POA: Diagnosis not present

## 2021-09-30 DIAGNOSIS — Z9889 Other specified postprocedural states: Secondary | ICD-10-CM | POA: Diagnosis not present

## 2021-10-04 DIAGNOSIS — Z7409 Other reduced mobility: Secondary | ICD-10-CM | POA: Diagnosis not present

## 2021-10-04 DIAGNOSIS — Z9889 Other specified postprocedural states: Secondary | ICD-10-CM | POA: Diagnosis not present

## 2021-10-04 DIAGNOSIS — M25552 Pain in left hip: Secondary | ICD-10-CM | POA: Diagnosis not present

## 2021-10-06 DIAGNOSIS — M25552 Pain in left hip: Secondary | ICD-10-CM | POA: Diagnosis not present

## 2021-10-06 DIAGNOSIS — Z7409 Other reduced mobility: Secondary | ICD-10-CM | POA: Diagnosis not present

## 2021-10-06 DIAGNOSIS — Z9889 Other specified postprocedural states: Secondary | ICD-10-CM | POA: Diagnosis not present

## 2021-10-10 DIAGNOSIS — Z9889 Other specified postprocedural states: Secondary | ICD-10-CM | POA: Diagnosis not present

## 2021-10-10 DIAGNOSIS — M25552 Pain in left hip: Secondary | ICD-10-CM | POA: Diagnosis not present

## 2021-10-10 DIAGNOSIS — Z7409 Other reduced mobility: Secondary | ICD-10-CM | POA: Diagnosis not present

## 2021-10-12 DIAGNOSIS — Z9889 Other specified postprocedural states: Secondary | ICD-10-CM | POA: Diagnosis not present

## 2021-10-12 DIAGNOSIS — M25552 Pain in left hip: Secondary | ICD-10-CM | POA: Diagnosis not present

## 2021-10-12 DIAGNOSIS — Z7409 Other reduced mobility: Secondary | ICD-10-CM | POA: Diagnosis not present

## 2021-10-17 DIAGNOSIS — Z7409 Other reduced mobility: Secondary | ICD-10-CM | POA: Diagnosis not present

## 2021-10-17 DIAGNOSIS — M25552 Pain in left hip: Secondary | ICD-10-CM | POA: Diagnosis not present

## 2021-10-17 DIAGNOSIS — Z9889 Other specified postprocedural states: Secondary | ICD-10-CM | POA: Diagnosis not present

## 2021-10-21 DIAGNOSIS — Z7409 Other reduced mobility: Secondary | ICD-10-CM | POA: Diagnosis not present

## 2021-10-21 DIAGNOSIS — Z9889 Other specified postprocedural states: Secondary | ICD-10-CM | POA: Diagnosis not present

## 2021-10-21 DIAGNOSIS — M25552 Pain in left hip: Secondary | ICD-10-CM | POA: Diagnosis not present

## 2021-10-26 DIAGNOSIS — M25552 Pain in left hip: Secondary | ICD-10-CM | POA: Diagnosis not present

## 2021-10-26 DIAGNOSIS — Z7409 Other reduced mobility: Secondary | ICD-10-CM | POA: Diagnosis not present

## 2021-10-26 DIAGNOSIS — Z9889 Other specified postprocedural states: Secondary | ICD-10-CM | POA: Diagnosis not present

## 2021-11-22 DIAGNOSIS — K219 Gastro-esophageal reflux disease without esophagitis: Secondary | ICD-10-CM | POA: Diagnosis not present

## 2021-11-22 DIAGNOSIS — L719 Rosacea, unspecified: Secondary | ICD-10-CM | POA: Diagnosis not present

## 2021-11-22 DIAGNOSIS — E782 Mixed hyperlipidemia: Secondary | ICD-10-CM | POA: Diagnosis not present

## 2021-11-22 DIAGNOSIS — E11319 Type 2 diabetes mellitus with unspecified diabetic retinopathy without macular edema: Secondary | ICD-10-CM | POA: Diagnosis not present

## 2022-01-17 DIAGNOSIS — E113313 Type 2 diabetes mellitus with moderate nonproliferative diabetic retinopathy with macular edema, bilateral: Secondary | ICD-10-CM | POA: Diagnosis not present

## 2022-01-17 DIAGNOSIS — H3589 Other specified retinal disorders: Secondary | ICD-10-CM | POA: Diagnosis not present

## 2022-01-17 DIAGNOSIS — H2513 Age-related nuclear cataract, bilateral: Secondary | ICD-10-CM | POA: Diagnosis not present

## 2022-01-17 DIAGNOSIS — E119 Type 2 diabetes mellitus without complications: Secondary | ICD-10-CM | POA: Diagnosis not present

## 2022-01-17 DIAGNOSIS — Z7984 Long term (current) use of oral hypoglycemic drugs: Secondary | ICD-10-CM | POA: Diagnosis not present

## 2022-05-18 DIAGNOSIS — H35353 Cystoid macular degeneration, bilateral: Secondary | ICD-10-CM | POA: Diagnosis not present

## 2022-05-18 DIAGNOSIS — Z7984 Long term (current) use of oral hypoglycemic drugs: Secondary | ICD-10-CM | POA: Diagnosis not present

## 2022-05-18 DIAGNOSIS — E119 Type 2 diabetes mellitus without complications: Secondary | ICD-10-CM | POA: Diagnosis not present

## 2022-05-18 DIAGNOSIS — E113313 Type 2 diabetes mellitus with moderate nonproliferative diabetic retinopathy with macular edema, bilateral: Secondary | ICD-10-CM | POA: Diagnosis not present

## 2022-05-18 DIAGNOSIS — H2513 Age-related nuclear cataract, bilateral: Secondary | ICD-10-CM | POA: Diagnosis not present

## 2022-05-23 DIAGNOSIS — K219 Gastro-esophageal reflux disease without esophagitis: Secondary | ICD-10-CM | POA: Diagnosis not present

## 2022-05-23 DIAGNOSIS — L719 Rosacea, unspecified: Secondary | ICD-10-CM | POA: Diagnosis not present

## 2022-05-23 DIAGNOSIS — E11319 Type 2 diabetes mellitus with unspecified diabetic retinopathy without macular edema: Secondary | ICD-10-CM | POA: Diagnosis not present

## 2022-05-23 DIAGNOSIS — E782 Mixed hyperlipidemia: Secondary | ICD-10-CM | POA: Diagnosis not present

## 2022-08-07 DIAGNOSIS — F439 Reaction to severe stress, unspecified: Secondary | ICD-10-CM | POA: Diagnosis not present

## 2022-08-09 DIAGNOSIS — F439 Reaction to severe stress, unspecified: Secondary | ICD-10-CM | POA: Diagnosis not present

## 2022-08-17 DIAGNOSIS — F439 Reaction to severe stress, unspecified: Secondary | ICD-10-CM | POA: Diagnosis not present

## 2022-09-28 DIAGNOSIS — H35353 Cystoid macular degeneration, bilateral: Secondary | ICD-10-CM | POA: Diagnosis not present

## 2022-09-28 DIAGNOSIS — Z7984 Long term (current) use of oral hypoglycemic drugs: Secondary | ICD-10-CM | POA: Diagnosis not present

## 2022-09-28 DIAGNOSIS — H2513 Age-related nuclear cataract, bilateral: Secondary | ICD-10-CM | POA: Diagnosis not present

## 2022-09-28 DIAGNOSIS — E119 Type 2 diabetes mellitus without complications: Secondary | ICD-10-CM | POA: Diagnosis not present

## 2022-09-28 DIAGNOSIS — E113313 Type 2 diabetes mellitus with moderate nonproliferative diabetic retinopathy with macular edema, bilateral: Secondary | ICD-10-CM | POA: Diagnosis not present

## 2022-10-02 DIAGNOSIS — Z1231 Encounter for screening mammogram for malignant neoplasm of breast: Secondary | ICD-10-CM | POA: Diagnosis not present

## 2022-10-23 DIAGNOSIS — F439 Reaction to severe stress, unspecified: Secondary | ICD-10-CM | POA: Diagnosis not present

## 2022-11-17 DIAGNOSIS — F439 Reaction to severe stress, unspecified: Secondary | ICD-10-CM | POA: Diagnosis not present

## 2022-11-23 DIAGNOSIS — F439 Reaction to severe stress, unspecified: Secondary | ICD-10-CM | POA: Diagnosis not present

## 2022-11-30 DIAGNOSIS — F439 Reaction to severe stress, unspecified: Secondary | ICD-10-CM | POA: Diagnosis not present

## 2022-12-05 DIAGNOSIS — Z1211 Encounter for screening for malignant neoplasm of colon: Secondary | ICD-10-CM | POA: Diagnosis not present

## 2022-12-05 DIAGNOSIS — Z23 Encounter for immunization: Secondary | ICD-10-CM | POA: Diagnosis not present

## 2022-12-05 DIAGNOSIS — E113313 Type 2 diabetes mellitus with moderate nonproliferative diabetic retinopathy with macular edema, bilateral: Secondary | ICD-10-CM | POA: Diagnosis not present

## 2022-12-05 DIAGNOSIS — Z0000 Encounter for general adult medical examination without abnormal findings: Secondary | ICD-10-CM | POA: Diagnosis not present

## 2022-12-05 DIAGNOSIS — K219 Gastro-esophageal reflux disease without esophagitis: Secondary | ICD-10-CM | POA: Diagnosis not present

## 2022-12-05 DIAGNOSIS — E782 Mixed hyperlipidemia: Secondary | ICD-10-CM | POA: Diagnosis not present

## 2022-12-21 DIAGNOSIS — F439 Reaction to severe stress, unspecified: Secondary | ICD-10-CM | POA: Diagnosis not present

## 2022-12-28 DIAGNOSIS — F439 Reaction to severe stress, unspecified: Secondary | ICD-10-CM | POA: Diagnosis not present

## 2023-01-18 DIAGNOSIS — F439 Reaction to severe stress, unspecified: Secondary | ICD-10-CM | POA: Diagnosis not present

## 2023-01-25 DIAGNOSIS — F439 Reaction to severe stress, unspecified: Secondary | ICD-10-CM | POA: Diagnosis not present

## 2023-02-01 DIAGNOSIS — E119 Type 2 diabetes mellitus without complications: Secondary | ICD-10-CM | POA: Diagnosis not present

## 2023-02-01 DIAGNOSIS — Z7984 Long term (current) use of oral hypoglycemic drugs: Secondary | ICD-10-CM | POA: Diagnosis not present

## 2023-02-01 DIAGNOSIS — H35353 Cystoid macular degeneration, bilateral: Secondary | ICD-10-CM | POA: Diagnosis not present

## 2023-02-01 DIAGNOSIS — E113313 Type 2 diabetes mellitus with moderate nonproliferative diabetic retinopathy with macular edema, bilateral: Secondary | ICD-10-CM | POA: Diagnosis not present

## 2023-02-01 DIAGNOSIS — H2513 Age-related nuclear cataract, bilateral: Secondary | ICD-10-CM | POA: Diagnosis not present

## 2023-02-02 DIAGNOSIS — F439 Reaction to severe stress, unspecified: Secondary | ICD-10-CM | POA: Diagnosis not present

## 2023-02-08 DIAGNOSIS — F439 Reaction to severe stress, unspecified: Secondary | ICD-10-CM | POA: Diagnosis not present

## 2023-02-14 DIAGNOSIS — H5213 Myopia, bilateral: Secondary | ICD-10-CM | POA: Diagnosis not present

## 2023-02-21 DIAGNOSIS — F439 Reaction to severe stress, unspecified: Secondary | ICD-10-CM | POA: Diagnosis not present

## 2023-02-28 DIAGNOSIS — F439 Reaction to severe stress, unspecified: Secondary | ICD-10-CM | POA: Diagnosis not present

## 2023-03-07 DIAGNOSIS — F439 Reaction to severe stress, unspecified: Secondary | ICD-10-CM | POA: Diagnosis not present

## 2023-03-19 DIAGNOSIS — F439 Reaction to severe stress, unspecified: Secondary | ICD-10-CM | POA: Diagnosis not present

## 2023-04-04 DIAGNOSIS — F439 Reaction to severe stress, unspecified: Secondary | ICD-10-CM | POA: Diagnosis not present

## 2023-04-18 DIAGNOSIS — K080 Exfoliation of teeth due to systemic causes: Secondary | ICD-10-CM | POA: Diagnosis not present

## 2023-04-20 DIAGNOSIS — F439 Reaction to severe stress, unspecified: Secondary | ICD-10-CM | POA: Diagnosis not present

## 2023-04-27 DIAGNOSIS — F439 Reaction to severe stress, unspecified: Secondary | ICD-10-CM | POA: Diagnosis not present

## 2023-05-04 DIAGNOSIS — F439 Reaction to severe stress, unspecified: Secondary | ICD-10-CM | POA: Diagnosis not present

## 2023-05-11 DIAGNOSIS — F439 Reaction to severe stress, unspecified: Secondary | ICD-10-CM | POA: Diagnosis not present

## 2023-05-18 DIAGNOSIS — F439 Reaction to severe stress, unspecified: Secondary | ICD-10-CM | POA: Diagnosis not present

## 2023-06-01 DIAGNOSIS — F439 Reaction to severe stress, unspecified: Secondary | ICD-10-CM | POA: Diagnosis not present

## 2023-06-06 DIAGNOSIS — H3581 Retinal edema: Secondary | ICD-10-CM | POA: Diagnosis not present

## 2023-06-06 DIAGNOSIS — E113313 Type 2 diabetes mellitus with moderate nonproliferative diabetic retinopathy with macular edema, bilateral: Secondary | ICD-10-CM | POA: Diagnosis not present

## 2023-06-06 DIAGNOSIS — H2513 Age-related nuclear cataract, bilateral: Secondary | ICD-10-CM | POA: Diagnosis not present

## 2023-06-06 DIAGNOSIS — E119 Type 2 diabetes mellitus without complications: Secondary | ICD-10-CM | POA: Diagnosis not present

## 2023-06-08 DIAGNOSIS — E782 Mixed hyperlipidemia: Secondary | ICD-10-CM | POA: Diagnosis not present

## 2023-06-08 DIAGNOSIS — E113313 Type 2 diabetes mellitus with moderate nonproliferative diabetic retinopathy with macular edema, bilateral: Secondary | ICD-10-CM | POA: Diagnosis not present

## 2023-06-08 DIAGNOSIS — K219 Gastro-esophageal reflux disease without esophagitis: Secondary | ICD-10-CM | POA: Diagnosis not present

## 2023-06-12 DIAGNOSIS — K219 Gastro-esophageal reflux disease without esophagitis: Secondary | ICD-10-CM | POA: Diagnosis not present

## 2023-06-12 DIAGNOSIS — Z209 Contact with and (suspected) exposure to unspecified communicable disease: Secondary | ICD-10-CM | POA: Diagnosis not present

## 2023-06-12 DIAGNOSIS — Z1211 Encounter for screening for malignant neoplasm of colon: Secondary | ICD-10-CM | POA: Diagnosis not present

## 2023-06-12 DIAGNOSIS — E113313 Type 2 diabetes mellitus with moderate nonproliferative diabetic retinopathy with macular edema, bilateral: Secondary | ICD-10-CM | POA: Diagnosis not present

## 2023-06-12 DIAGNOSIS — E782 Mixed hyperlipidemia: Secondary | ICD-10-CM | POA: Diagnosis not present

## 2023-06-15 DIAGNOSIS — F439 Reaction to severe stress, unspecified: Secondary | ICD-10-CM | POA: Diagnosis not present

## 2023-06-29 DIAGNOSIS — F439 Reaction to severe stress, unspecified: Secondary | ICD-10-CM | POA: Diagnosis not present

## 2023-07-13 DIAGNOSIS — F439 Reaction to severe stress, unspecified: Secondary | ICD-10-CM | POA: Diagnosis not present

## 2023-07-17 DIAGNOSIS — Z1211 Encounter for screening for malignant neoplasm of colon: Secondary | ICD-10-CM | POA: Diagnosis not present

## 2023-08-09 DIAGNOSIS — F439 Reaction to severe stress, unspecified: Secondary | ICD-10-CM | POA: Diagnosis not present

## 2023-08-15 DIAGNOSIS — R195 Other fecal abnormalities: Secondary | ICD-10-CM | POA: Diagnosis not present

## 2023-08-29 DIAGNOSIS — Z83719 Family history of colon polyps, unspecified: Secondary | ICD-10-CM | POA: Diagnosis not present

## 2023-08-29 DIAGNOSIS — Z1211 Encounter for screening for malignant neoplasm of colon: Secondary | ICD-10-CM | POA: Diagnosis not present

## 2023-08-29 DIAGNOSIS — Z800 Family history of malignant neoplasm of digestive organs: Secondary | ICD-10-CM | POA: Diagnosis not present

## 2023-08-29 DIAGNOSIS — D125 Benign neoplasm of sigmoid colon: Secondary | ICD-10-CM | POA: Diagnosis not present

## 2023-08-29 DIAGNOSIS — D122 Benign neoplasm of ascending colon: Secondary | ICD-10-CM | POA: Diagnosis not present

## 2023-08-29 DIAGNOSIS — R195 Other fecal abnormalities: Secondary | ICD-10-CM | POA: Diagnosis not present

## 2023-08-29 DIAGNOSIS — K648 Other hemorrhoids: Secondary | ICD-10-CM | POA: Diagnosis not present

## 2023-08-29 DIAGNOSIS — K5730 Diverticulosis of large intestine without perforation or abscess without bleeding: Secondary | ICD-10-CM | POA: Diagnosis not present

## 2023-08-30 DIAGNOSIS — F439 Reaction to severe stress, unspecified: Secondary | ICD-10-CM | POA: Diagnosis not present

## 2023-09-03 DIAGNOSIS — D125 Benign neoplasm of sigmoid colon: Secondary | ICD-10-CM | POA: Diagnosis not present

## 2023-09-03 DIAGNOSIS — D122 Benign neoplasm of ascending colon: Secondary | ICD-10-CM | POA: Diagnosis not present

## 2023-09-13 DIAGNOSIS — F439 Reaction to severe stress, unspecified: Secondary | ICD-10-CM | POA: Diagnosis not present

## 2023-09-17 DIAGNOSIS — E11319 Type 2 diabetes mellitus with unspecified diabetic retinopathy without macular edema: Secondary | ICD-10-CM | POA: Diagnosis not present

## 2023-09-17 DIAGNOSIS — E113313 Type 2 diabetes mellitus with moderate nonproliferative diabetic retinopathy with macular edema, bilateral: Secondary | ICD-10-CM | POA: Diagnosis not present

## 2023-09-17 DIAGNOSIS — E782 Mixed hyperlipidemia: Secondary | ICD-10-CM | POA: Diagnosis not present

## 2023-09-17 DIAGNOSIS — E669 Obesity, unspecified: Secondary | ICD-10-CM | POA: Diagnosis not present

## 2023-09-20 DIAGNOSIS — F439 Reaction to severe stress, unspecified: Secondary | ICD-10-CM | POA: Diagnosis not present

## 2023-10-04 DIAGNOSIS — F439 Reaction to severe stress, unspecified: Secondary | ICD-10-CM | POA: Diagnosis not present

## 2023-10-05 DIAGNOSIS — Z1231 Encounter for screening mammogram for malignant neoplasm of breast: Secondary | ICD-10-CM | POA: Diagnosis not present

## 2023-10-18 DIAGNOSIS — E782 Mixed hyperlipidemia: Secondary | ICD-10-CM | POA: Diagnosis not present

## 2023-10-18 DIAGNOSIS — E669 Obesity, unspecified: Secondary | ICD-10-CM | POA: Diagnosis not present

## 2023-10-18 DIAGNOSIS — E113313 Type 2 diabetes mellitus with moderate nonproliferative diabetic retinopathy with macular edema, bilateral: Secondary | ICD-10-CM | POA: Diagnosis not present

## 2023-10-18 DIAGNOSIS — E11319 Type 2 diabetes mellitus with unspecified diabetic retinopathy without macular edema: Secondary | ICD-10-CM | POA: Diagnosis not present

## 2023-11-01 DIAGNOSIS — F439 Reaction to severe stress, unspecified: Secondary | ICD-10-CM | POA: Diagnosis not present

## 2023-11-16 DIAGNOSIS — F439 Reaction to severe stress, unspecified: Secondary | ICD-10-CM | POA: Diagnosis not present

## 2023-11-18 DIAGNOSIS — E782 Mixed hyperlipidemia: Secondary | ICD-10-CM | POA: Diagnosis not present

## 2023-11-18 DIAGNOSIS — E113313 Type 2 diabetes mellitus with moderate nonproliferative diabetic retinopathy with macular edema, bilateral: Secondary | ICD-10-CM | POA: Diagnosis not present

## 2023-11-18 DIAGNOSIS — E669 Obesity, unspecified: Secondary | ICD-10-CM | POA: Diagnosis not present

## 2023-11-18 DIAGNOSIS — E11319 Type 2 diabetes mellitus with unspecified diabetic retinopathy without macular edema: Secondary | ICD-10-CM | POA: Diagnosis not present

## 2023-11-29 DIAGNOSIS — F439 Reaction to severe stress, unspecified: Secondary | ICD-10-CM | POA: Diagnosis not present

## 2023-12-03 DIAGNOSIS — E113313 Type 2 diabetes mellitus with moderate nonproliferative diabetic retinopathy with macular edema, bilateral: Secondary | ICD-10-CM | POA: Diagnosis not present

## 2023-12-03 DIAGNOSIS — K219 Gastro-esophageal reflux disease without esophagitis: Secondary | ICD-10-CM | POA: Diagnosis not present

## 2023-12-03 DIAGNOSIS — E782 Mixed hyperlipidemia: Secondary | ICD-10-CM | POA: Diagnosis not present

## 2023-12-05 DIAGNOSIS — K080 Exfoliation of teeth due to systemic causes: Secondary | ICD-10-CM | POA: Diagnosis not present

## 2023-12-06 DIAGNOSIS — E113313 Type 2 diabetes mellitus with moderate nonproliferative diabetic retinopathy with macular edema, bilateral: Secondary | ICD-10-CM | POA: Diagnosis not present

## 2023-12-06 DIAGNOSIS — K219 Gastro-esophageal reflux disease without esophagitis: Secondary | ICD-10-CM | POA: Diagnosis not present

## 2023-12-06 DIAGNOSIS — Z0000 Encounter for general adult medical examination without abnormal findings: Secondary | ICD-10-CM | POA: Diagnosis not present

## 2023-12-06 DIAGNOSIS — E782 Mixed hyperlipidemia: Secondary | ICD-10-CM | POA: Diagnosis not present

## 2023-12-12 DIAGNOSIS — H25013 Cortical age-related cataract, bilateral: Secondary | ICD-10-CM | POA: Diagnosis not present

## 2023-12-12 DIAGNOSIS — H35353 Cystoid macular degeneration, bilateral: Secondary | ICD-10-CM | POA: Diagnosis not present

## 2023-12-12 DIAGNOSIS — E113313 Type 2 diabetes mellitus with moderate nonproliferative diabetic retinopathy with macular edema, bilateral: Secondary | ICD-10-CM | POA: Diagnosis not present

## 2023-12-12 DIAGNOSIS — H2513 Age-related nuclear cataract, bilateral: Secondary | ICD-10-CM | POA: Diagnosis not present

## 2023-12-18 DIAGNOSIS — E669 Obesity, unspecified: Secondary | ICD-10-CM | POA: Diagnosis not present

## 2023-12-18 DIAGNOSIS — E11319 Type 2 diabetes mellitus with unspecified diabetic retinopathy without macular edema: Secondary | ICD-10-CM | POA: Diagnosis not present

## 2023-12-18 DIAGNOSIS — E782 Mixed hyperlipidemia: Secondary | ICD-10-CM | POA: Diagnosis not present

## 2023-12-18 DIAGNOSIS — E113313 Type 2 diabetes mellitus with moderate nonproliferative diabetic retinopathy with macular edema, bilateral: Secondary | ICD-10-CM | POA: Diagnosis not present

## 2023-12-27 DIAGNOSIS — F439 Reaction to severe stress, unspecified: Secondary | ICD-10-CM | POA: Diagnosis not present

## 2024-01-18 DIAGNOSIS — E669 Obesity, unspecified: Secondary | ICD-10-CM | POA: Diagnosis not present

## 2024-01-18 DIAGNOSIS — E11319 Type 2 diabetes mellitus with unspecified diabetic retinopathy without macular edema: Secondary | ICD-10-CM | POA: Diagnosis not present

## 2024-01-18 DIAGNOSIS — F439 Reaction to severe stress, unspecified: Secondary | ICD-10-CM | POA: Diagnosis not present

## 2024-01-18 DIAGNOSIS — E113313 Type 2 diabetes mellitus with moderate nonproliferative diabetic retinopathy with macular edema, bilateral: Secondary | ICD-10-CM | POA: Diagnosis not present

## 2024-01-18 DIAGNOSIS — E782 Mixed hyperlipidemia: Secondary | ICD-10-CM | POA: Diagnosis not present

## 2024-02-01 DIAGNOSIS — F439 Reaction to severe stress, unspecified: Secondary | ICD-10-CM | POA: Diagnosis not present
